# Patient Record
Sex: Male | Born: 1995 | Race: Black or African American | Hispanic: No | Marital: Single | Smoking: Never smoker
Health system: Southern US, Community
[De-identification: ages and names within clinical notes are randomized; demographics above are authoritative.]

## PROBLEM LIST (undated history)

## (undated) DIAGNOSIS — J020 Streptococcal pharyngitis: Secondary | ICD-10-CM

## (undated) DIAGNOSIS — J45909 Unspecified asthma, uncomplicated: Secondary | ICD-10-CM

## (undated) HISTORY — PX: ABDOMINAL SURGERY: SHX537

---
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
## 2016-09-23 ENCOUNTER — Emergency Department (HOSPITAL_COMMUNITY)
Admission: EM | Admit: 2016-09-23 | Discharge: 2016-09-23 | Disposition: A | Discharge status: Home / Self Care | Payer: Self-pay | Attending: Emergency Medicine | Admitting: Emergency Medicine

## 2016-09-23 ENCOUNTER — Encounter (HOSPITAL_COMMUNITY): Payer: Self-pay

## 2016-09-23 DIAGNOSIS — J029 Acute pharyngitis, unspecified: Secondary | ICD-10-CM | POA: Insufficient documentation

## 2016-09-23 DIAGNOSIS — J039 Acute tonsillitis, unspecified: Secondary | ICD-10-CM

## 2016-09-23 DIAGNOSIS — J45909 Unspecified asthma, uncomplicated: Secondary | ICD-10-CM | POA: Insufficient documentation

## 2016-09-23 HISTORY — DX: Streptococcal pharyngitis: J02.0

## 2016-09-23 LAB — RAPID STREP SCREEN (MED CTR MEBANE ONLY): Streptococcus, Group A Screen (Direct): NEGATIVE

## 2016-09-23 MED ORDER — DEXAMETHASONE SODIUM PHOSPHATE 10 MG/ML IJ SOLN
10.0000 mg | Freq: Once | INTRAMUSCULAR | Status: AC
  Administered 2016-09-23: 10 mg via INTRAMUSCULAR
  Filled 2016-09-23: qty 1

## 2016-09-23 NOTE — Discharge Instructions (Signed)
Your strep test was negative; your symptoms are likely either a viral illness, or could just be from chronic inflammation of the tonsils. You have been given a medication here that should help with the sore throat and tonsil swelling. Continue to stay well-hydrated. Gargle warm salt water and spit it out. Use chloraseptic spray as needed for sore throat. Continue to alternate between Tylenol and Ibuprofen for pain or fever. Use Mucinex for cough suppression/expectoration of mucus. Use netipot and flonase to help with nasal congestion. May consider over-the-counter Benadryl or other antihistamine to decrease secretions and for help with your symptoms. Follow up with your primary care doctor in 5-7 days for recheck of ongoing symptoms. May also consider following up with the ENT doctor in 1-2 weeks for ongoing management of your tonsil swelling and recurrent sore throats. Return to emergency department for emergent changing or worsening of symptoms.

## 2016-09-23 NOTE — ED Provider Notes (Signed)
[facility]-EMERGENCY DEPT Provider Note   CSN: [id number] Arrival date & time: 09/23/16  1140   By signing my name below, I, Frank Walters, attest that this documentation has been prepared under the direction and in the presence of [address], [employer]. Electronically signed, Frank Walters, ED Scribe. 09/23/16. 11:58 AM.   History   Chief Complaint Chief Complaint  Patient presents with  . Sore Throat   The history is provided by the patient and medical records. No language interpreter was used.  Sore Throat  This is a recurrent problem. The current episode started more than 2 days ago. The problem occurs constantly. The problem has been gradually worsening. Pertinent negatives include no chest pain, no abdominal pain and no shortness of breath. The symptoms are aggravated by swallowing. Nothing relieves the symptoms. He has tried water for the symptoms. The treatment provided no relief.    HPI Comments: Frank Walters is a 21 y.o. male who presents to the Emergency Department complaining of recurrent sore throat x 2-3 days, and ongoing tonsillar swelling and "spots" on tonsils x1 month. Pt states he was diagnosed with strep throat and placed on abx (unknown name) ~ 1 month ago, and after that the pain had improved, but he continued having tonsillar swelling and white spots. About 2-3 days ago the pain returned.  Pt describes the pain as 1/10, stinging constant nonradiating posterior throat pain that is worsened with swallowing and is unrelieved by saltwater gargling. No other tx tried. He reports associated mild cough that was initially productive a few days ago but has now become a dry cough that has improved. He adds Pt states he uses vaporized tobacco products regularly, though he has stopped since the onset of his symptoms. Pt denies drooling, difficulty swallowing, trismus, throat redness (unable to see), ear pain/drainage, rhinorrhea, fevers, chills, CP, SOB, abd pain, N/V/D/C, hematuria,  dysuria, myalgias, arthralgias, numbness, tingling, focal weakness, rashes, or any other complaints at this time. No known sick contacts. Had recurrent strep as a child, but hasn't had much issue in his adulthood.  Past Medical History:  Diagnosis Date  . Asthma   . Strep throat     There are no active problems to display for this patient.   Past Surgical History:  Procedure Laterality Date  . ABDOMINAL SURGERY         Home Medications    Prior to Admission medications   Medication Sig Start Date End Date Taking? Authorizing Provider  albuterol (PROVENTIL HFA;VENTOLIN HFA) 108 (90 BASE) MCG/ACT inhaler Inhale 2 puffs into the lungs every 6 (six) hours as needed for shortness of breath.    Historical Provider, MD  diphenhydrAMINE (BENADRYL) 25 mg capsule Take 25 mg by mouth every 6 (six) hours as needed for itching (Swelling of eyes).    Historical Provider, MD  ibuprofen (ADVIL,MOTRIN) 800 MG tablet Take 1 tablet (800 mg total) by mouth every 8 (eight) hours as needed for pain. 02/07/13   Christopher Lawyer, PA-C  montelukast (SINGULAIR) 10 MG tablet Take 10 mg by mouth daily as needed (allergies).    Historical Provider, MD  polyethylene glycol (MIRALAX / GLYCOLAX) packet Take 17 g by mouth daily as needed (Constipation).    Historical Provider, MD    Family History History reviewed. No pertinent family history.  Social History Social History  Substance Use Topics  . Smoking status: Never Smoker  . Smokeless tobacco: Never Used  . Alcohol use No     Allergies  Patient has no known allergies.   Review of Systems Review of Systems  Constitutional: Negative for chills and fever.  HENT: Positive for sore throat. Negative for drooling, ear discharge, ear pain, rhinorrhea and trouble swallowing.   Respiratory: Positive for cough. Negative for shortness of breath.   Cardiovascular: Negative for chest pain.  Gastrointestinal: Negative for abdominal pain, constipation,  diarrhea, nausea and vomiting.  Genitourinary: Negative for dysuria and hematuria.  Musculoskeletal: Negative for arthralgias and myalgias.  Skin: Negative for rash.  Allergic/Immunologic: Negative for immunocompromised state.  Neurological: Negative for weakness and numbness.  Psychiatric/Behavioral: Negative for confusion.  10 Systems reviewed and all are negative for acute change except as noted in the HPI.     Physical Exam Updated Vital Signs BP (!) 143/92   Pulse 70   Temp 98.3 F (36.8 C)   Resp 18   Ht  (1.6 m)   Wt 160 lb (72.6 kg)   SpO2 95%   BMI 28.34 kg/m   Physical Exam  Constitutional: He is oriented to person, place, and time. Vital signs are normal. He appears well-developed and well-nourished.  Non-toxic appearance. No distress.  Afebrile, nontoxic, NAD  HENT:  Head: Normocephalic and atraumatic.  Nose: Nose normal.  Mouth/Throat: Uvula is midline and mucous membranes are normal. No trismus in the jaw. No uvula swelling. Posterior oropharyngeal erythema present. No oropharyngeal exudate, posterior oropharyngeal edema or tonsillar abscesses. Tonsils are 2+ on the right. Tonsils are 2+ on the left. No tonsillar exudate.  Nose clear. Oropharynx injected, without uvular swelling or deviation, no trismus or drooling, with 2+ bilateral tonsillar hypertrophy/swelling and erythema, +tonsilloliths noted to L tonsil, but no exudates.  No PTA. Handling secretions well.  Eyes: Conjunctivae and EOM are normal. Right eye exhibits no discharge. Left eye exhibits no discharge.  Neck: Normal range of motion. Neck supple.  Cardiovascular: Normal rate and intact distal pulses.   Pulmonary/Chest: Effort normal and breath sounds normal. No respiratory distress. He has no decreased breath sounds. He has no wheezes. He has no rhonchi. He has no rales.  CTAB in all lung fields, no w/r/r, no hypoxia or increased WOB, speaking in full sentences, SpO2 95% on RA   Abdominal: Normal  appearance. He exhibits no distension.  Musculoskeletal: Normal range of motion.  Neurological: He is alert and oriented to person, place, and time. He has normal strength. No sensory deficit.  Skin: Skin is warm, dry and intact. No rash noted.  Psychiatric: He has a normal mood and affect.  Nursing note and vitals reviewed.    ED Treatments / Results  DIAGNOSTIC STUDIES: Oxygen Saturation is 95% on RA, normal by my interpretation.    COORDINATION OF CARE: 11:58 AM Discussed treatment plan with pt at bedside and pt agreed to plan. Will order medications.  Labs (all labs ordered are listed, but only abnormal results are displayed) Labs Reviewed  RAPID STREP SCREEN (NOT AT [facility])  CULTURE, GROUP A STREP [facility])    EKG  EKG Interpretation None       Radiology No results found.  Procedures Procedures (including critical care time)  Medications Ordered in ED Medications  dexamethasone (DECADRON) injection 10 mg (10 mg Intramuscular Given 09/23/16 1221)     Initial Impression / Assessment and Plan / ED Course  I have reviewed the triage vital signs and the nursing notes.  Pertinent labs & imaging results that were available during my care of the patient were reviewed by me and considered  in my medical decision making (see chart for details).     21 y.o. male here with ongoing tonsillar swelling and spots on tonsils x1 month s/p tx for strep, and 2 days of pain. On exam, tonsils 2+ bilaterally, appear hypertrophied but pt reports he hadn't had swollen tonsils until last month when he had strep; +tonsilloliths on L tonsil, no exudates noted. No PTA, handling secretions well. Afebrile and nontoxic, clear lung exam. Will obtain RST, and give decadron to help with swelling/pain. Will reassess shortly  1:00 PM RST negative. Could be viral pharyngitis, or just chronic tonsillar hypertrophy. Will have him f/up with ENT to discuss possible tonsillectomy if continues to be bothersome,  or continues to cause recurrent strep. Advised OTC remedies for symptomatic relief. F/up with PCP in 1wk for recheck of symptoms,and with ENT in 1-2wks for discussion of recurrent tonsillitis. I explained the diagnosis and have given explicit precautions to return to the ER including for any other new or worsening symptoms. The patient understands and accepts the medical plan as it's been dictated and I have answered their questions. Discharge instructions concerning home care and prescriptions have been given. The patient is STABLE and is discharged to home in good condition.   I personally performed the services described in this documentation, which was scribed in my presence. The recorded information has been reviewed and is accurate.   Final Clinical Impressions(s) / ED Diagnoses   Final diagnoses:  Tonsillitis  Viral pharyngitis    New Prescriptions New Prescriptions   No medications on file     [address] PA-C 09/23/16 1301    Azalia Bilis, MD 09/23/16 1719

## 2016-09-23 NOTE — ED Notes (Signed)
PT DISCHARGED. INSTRUCTIONS GIVEN. AAOX4. PT IN NO APPARENT DISTRESS OR PAIN. THE OPPORTUNITY TO ASK QUESTIONS WAS PROVIDED. 

## 2016-09-23 NOTE — ED Triage Notes (Signed)
PT C/O SORE THROAT WITH SWELLING, REDNESS, AND WHITE SPOTS X3 DAYS. PT STS HE WAS SEEN AND TX'D FOR STREP THROAT A MONTH AGO. HE STS THE SWELLING NEVER WENT AWAY. DENIES FEVER, BUT STS SWALLOWING IS BECOMING MORE PAINFUL.

## 2016-09-24 LAB — CULTURE, GROUP A STREP (THRC)
